# Patient Record
Sex: Male | Born: 2000 | Race: Black or African American | Hispanic: No | Marital: Single | Smoking: Never smoker
Health system: Southern US, Community
[De-identification: ages and names within clinical notes are randomized; demographics above are authoritative.]

## PROBLEM LIST (undated history)

## (undated) DIAGNOSIS — J45909 Unspecified asthma, uncomplicated: Secondary | ICD-10-CM

---
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
## 2022-05-30 ENCOUNTER — Emergency Department (HOSPITAL_COMMUNITY): Payer: Medicaid Other

## 2022-05-30 ENCOUNTER — Emergency Department (HOSPITAL_COMMUNITY)
Admission: EM | Admit: 2022-05-30 | Discharge: 2022-05-30 | Disposition: A | Discharge status: Home / Self Care | Payer: Medicaid Other | Attending: Emergency Medicine | Admitting: Emergency Medicine

## 2022-05-30 ENCOUNTER — Encounter (HOSPITAL_COMMUNITY): Payer: Self-pay

## 2022-05-30 ENCOUNTER — Other Ambulatory Visit: Payer: Self-pay

## 2022-05-30 DIAGNOSIS — R0781 Pleurodynia: Secondary | ICD-10-CM | POA: Insufficient documentation

## 2022-05-30 DIAGNOSIS — R0602 Shortness of breath: Secondary | ICD-10-CM | POA: Diagnosis not present

## 2022-05-30 DIAGNOSIS — M549 Dorsalgia, unspecified: Secondary | ICD-10-CM | POA: Diagnosis not present

## 2022-05-30 DIAGNOSIS — R0600 Dyspnea, unspecified: Secondary | ICD-10-CM | POA: Diagnosis not present

## 2022-05-30 DIAGNOSIS — J45909 Unspecified asthma, uncomplicated: Secondary | ICD-10-CM | POA: Insufficient documentation

## 2022-05-30 DIAGNOSIS — R079 Chest pain, unspecified: Secondary | ICD-10-CM | POA: Diagnosis not present

## 2022-05-30 LAB — BASIC METABOLIC PANEL
Anion gap: 8 (ref 5–15)
BUN: 10 mg/dL (ref 6–20)
CO2: 28 mmol/L (ref 22–32)
Calcium: 9.2 mg/dL (ref 8.9–10.3)
Chloride: 103 mmol/L (ref 98–111)
Creatinine, Ser: 0.87 mg/dL (ref 0.61–1.24)
GFR, Estimated: >60 mL/min (ref >60)
Glucose, Bld: 101 mg/dL — ABNORMAL HIGH (ref 70–99)
Potassium: 3.8 mmol/L (ref 3.5–5.1)
Sodium: 139 mmol/L (ref 135–145)

## 2022-05-30 LAB — CBC
HCT: 43.8 % (ref 39.0–52.0)
Hemoglobin: 14.5 g/dL (ref 13.0–17.0)
MCH: 28.5 pg (ref 26.0–34.0)
MCHC: 33.1 g/dL (ref 30.0–36.0)
MCV: 86.2 fL (ref 80.0–100.0)
Platelets: 286 10*3/uL (ref 150–400)
RBC: 5.08 MIL/uL (ref 4.22–5.81)
RDW: 13.9 % (ref 11.5–15.5)
WBC: 8.4 10*3/uL (ref 4.0–10.5)
nRBC: 0 % (ref 0.0–0.2)

## 2022-05-30 LAB — D-DIMER, QUANTITATIVE: D-Dimer, Quant: 0.28 ug/mL-FEU (ref 0.00–0.50)

## 2022-05-30 MED ORDER — HYDROCODONE-ACETAMINOPHEN 5-325 MG PO TABS
1.0000 | ORAL_TABLET | Freq: Once | ORAL | Status: AC
  Administered 2022-05-30: 1 via ORAL
  Filled 2022-05-30: qty 1

## 2022-05-30 MED ORDER — ALBUTEROL SULFATE HFA 108 (90 BASE) MCG/ACT IN AERS
2.0000 | INHALATION_SPRAY | Freq: Once | RESPIRATORY_TRACT | Status: AC
  Administered 2022-05-30: 2 via RESPIRATORY_TRACT
  Filled 2022-05-30: qty 6.7

## 2022-05-30 NOTE — ED Provider Notes (Signed)
[facility] EMERGENCY DEPARTMENT Provider Note   CSN: [id number] Arrival date & time: 05/30/22  0158     History  Chief Complaint  Patient presents with   Back Pain    Henry Krueger is a 21 y.o. male.  HPI Patient with history of obesity and asthma presents with 2 complaints.  Patient reports about 2 days ago he began having left-sided back pain.  No recent falls or trauma.  He does report recent heavy lifting about a week ago.  He also reports increasing shortness of breath of the past 2 days.  Appears to worsen tonight.  He reports that shortness of breath is improved with ambulation.  No fevers or vomiting.  No anterior chest pain.  No coughing or URI symptoms.  He is a non-smoker.   Past Medical History:  Diagnosis Date   Asthma     Home Medications Prior to Admission medications   Not on File      Allergies    Patient has no known allergies.    Review of Systems   Review of Systems  HENT:  Negative for sore throat.   Respiratory:  Positive for shortness of breath. Negative for cough.     Physical Exam Updated Vital Signs BP 127/79 (BP Location: Right Arm)   Pulse 86   Temp 97.7 F (36.5 C) (Oral)   Resp 18   Ht 1.727 m (5\' 8" )   Wt (!) 150.1 kg   SpO2 99%   BMI 50.33 kg/m  Physical Exam CONSTITUTIONAL: Well developed/well nourished HEAD: Normocephalic/atraumatic EYES: EOMI/PERRL ENMT: Mucous membranes moist NECK: supple no meningeal signs SPINE/BACK:entire spine nontender No bruising/crepitance/stepoffs noted to spine CV: S1/S2 noted, no murmurs/rubs/gallops noted LUNGS: Lungs are clear to auscultation bilaterally, no apparent distress Chest-mild tenderness to left posterior chest wall, no crepitus or bruising no rash ABDOMEN: soft, nontender, no rebound or guarding, bowel sounds noted throughout abdomen GU:no cva tenderness NEURO: Pt is awake/alert/appropriate, moves all extremitiesx4.  No facial droop.   EXTREMITIES: pulses normal/equal,  full ROM, no calf tenderness SKIN: warm, color normal PSYCH: Mildly anxious  ED Results / Procedures / Treatments   Labs (all labs ordered are listed, but only abnormal results are displayed) Labs Reviewed  BASIC METABOLIC PANEL - Abnormal; Notable for the following components:      Result Value   Glucose, Bld 101 (*)    All other components within normal limits  CBC  D-DIMER, QUANTITATIVE    EKG EKG Interpretation  Date/Time:  Saturday May 30 2022 04:36:49 EST Ventricular Rate:  99 PR Interval:  140 QRS Duration: 79 QT Interval:  319 QTC Calculation: 410 R Axis:   58 Text Interpretation: Sinus rhythm Interpretation limited secondary to artifact No previous ECGs available Confirmed by [id number] (Zadie Rhine) on 05/30/2022 4:39:13 AM  Radiology DG Chest 2 View  Result Date: 05/30/2022 CLINICAL DATA:  Left back/muscle strain and pain. Difficulty breathing. History of asthma. EXAM: CHEST - 2 VIEW COMPARISON:  09/26/2010. FINDINGS: The heart size and mediastinal contours are within normal limits. Both lungs are clear. No acute osseous abnormality. IMPRESSION: No active cardiopulmonary disease. Electronically Signed   By: 11/26/2010 M.D.   On: 05/30/2022 04:37    Procedures Procedures    Medications Ordered in ED Medications  albuterol (VENTOLIN HFA) 108 (90 Base) MCG/ACT inhaler 2 puff (has no administration in time range)  HYDROcodone-acetaminophen (NORCO/VICODIN) 5-325 MG per tablet 1 tablet (1 tablet Oral Given 05/30/22 0449)    ED  Course/ Medical Decision Making/ A&P Clinical Course as of 05/30/22 [id number]  Sat May 30, 2022  0508 Unclear cause of shortness of breath.  He did ambulate without hypoxia, became tachycardic.  Will do further exploration including D-dimer.  He reports he has outgrown his asthma and has not had any recent exacerbations, and he has no wheezing at this time [DW]  0638 Vitals improved.  No hypoxia.  He is resting comfortably.  He reports he  only has pain now when he takes a deep breath or moves.  He is low risk for PE, and D-dimer is negative.  X-ray is clear, low suspicion for CHF.  Patient will be discharged home.  I have given instructions to follow-up as an outpatient. [DW]    Clinical Course User Index [DW] Ripley Fraise, MD                           Medical Decision Making Amount and/or Complexity of Data Reviewed Labs: ordered. Radiology: ordered. ECG/medicine tests: ordered.  Risk Prescription drug management.   This patient presents to the ED for concern of shortness of breath, this involves an extensive number of treatment options, and is a complaint that carries with it a high risk of complications and morbidity.  The differential diagnosis includes but is not limited to Acute coronary syndrome, pneumonia, acute pulmonary edema, pneumothorax, acute anemia, pulmonary embolism, asthma attack    Comorbidities that complicate the patient evaluation: Patient's presentation is complicated by their history of obesity and asthma  Social Determinants of Health: Patient's impaired access to primary care  increases the complexity of managing their presentation  Additional history obtained: Additional history obtained from family   Lab Tests: I Ordered, and personally interpreted labs.  The pertinent results include: Labs overall unremarkable  Imaging Studies ordered: I ordered imaging studies including X-ray chest   I independently visualized and interpreted imaging which showed no acute findings I agree with the radiologist interpretation   Medicines ordered and prescription drug management: I ordered medication including Vicodin for pain Reevaluation of the patient after these medicines showed that the patient    improved  Test Considered: Patient is low risk / negative by Wells criteria with negative D-dimer, therefore do not feel that CT chest is indicated.   Reevaluation: After the interventions  noted above, I reevaluated the patient and found that they have :improved  Complexity of problems addressed: Patient's presentation is most consistent with  acute presentation with potential threat to life or bodily function  Disposition: After consideration of the diagnostic results and the patient's response to treatment,  I feel that the patent would benefit from discharge   .           Final Clinical Impression(s) / ED Diagnoses Final diagnoses:  Shortness of breath  Rib pain    Rx / DC Orders ED Discharge Orders     None         Ripley Fraise, MD 05/30/22 [phone]

## 2022-05-30 NOTE — ED Notes (Signed)
pt ambulated with out difficulty around the nursing station. o2 remained 98% pulse increased to 122

## 2022-05-30 NOTE — ED Triage Notes (Signed)
Pt arrived via [facility] c/o left back/muscle strain/pain and difficulty breathing. Pt endorses Hx of asthma and reports he does not have an inhaler. Pt reports he was helping his Aunt move 4 days ago and back pain began shortly after.

## 2022-07-29 DIAGNOSIS — U071 COVID-19: Secondary | ICD-10-CM | POA: Diagnosis not present

## 2022-10-03 ENCOUNTER — Other Ambulatory Visit: Payer: Self-pay

## 2022-10-03 ENCOUNTER — Encounter (HOSPITAL_COMMUNITY): Payer: Self-pay | Admitting: Emergency Medicine

## 2022-10-03 ENCOUNTER — Emergency Department (HOSPITAL_COMMUNITY)
Admission: EM | Admit: 2022-10-03 | Discharge: 2022-10-03 | Disposition: A | Discharge status: Home / Self Care | Payer: Self-pay | Attending: Emergency Medicine | Admitting: Emergency Medicine

## 2022-10-03 ENCOUNTER — Emergency Department (HOSPITAL_COMMUNITY): Payer: Medicaid Other

## 2022-10-03 DIAGNOSIS — X501XXA Overexertion from prolonged static or awkward postures, initial encounter: Secondary | ICD-10-CM | POA: Insufficient documentation

## 2022-10-03 DIAGNOSIS — M25562 Pain in left knee: Secondary | ICD-10-CM | POA: Insufficient documentation

## 2022-10-03 DIAGNOSIS — Y9367 Activity, basketball: Secondary | ICD-10-CM | POA: Insufficient documentation

## 2022-10-03 MED ORDER — IBUPROFEN 400 MG PO TABS
400.0000 mg | ORAL_TABLET | Freq: Once | ORAL | Status: AC
  Administered 2022-10-03: 400 mg via ORAL
  Filled 2022-10-03: qty 1

## 2022-10-03 NOTE — ED Provider Notes (Signed)
[place] EMERGENCY DEPARTMENT AT [facility] Provider Note   CSN: [id number] Arrival date & time: 10/03/22  0846     History  Chief Complaint  Patient presents with   Knee Pain    Henry Krueger is a 22 y.o. male, no pertinent past medical history, who presents to the ED secondary to left knee pain that is been going on for the last couple days.  He states that he was playing some basketball on Wednesday, and landed hard after jumping, and had some knee pain.  Since then has had progressive worsening of left knee pain, and difficulty bearing weight.  He states that it hurts more to straighten it out, and feels like it is often locking.  He states that he is concerned because it is just so painful to bear weight on.  Denies any redness, swelling, trauma to the knee other than when jumping. No past surgery on knee.    Home Medications Prior to Admission medications   Not on File      Allergies    Patient has no known allergies.    Review of Systems   Review of Systems  Musculoskeletal:        +L Knee pain    Physical Exam Updated Vital Signs BP 126/84 (BP Location: Right Arm)   Pulse 99   Temp 98.3 F (36.8 C) (Oral)   Resp 19   Ht  (1.727 m)   Wt (!) 150.1 kg   SpO2 100%   BMI 50.31 kg/m  Physical Exam Vitals and nursing note reviewed.  Constitutional:      General: He is not in acute distress.    Appearance: He is well-developed.  HENT:     Head: Normocephalic and atraumatic.  Eyes:     General:        Right eye: No discharge.        Left eye: No discharge.     Conjunctiva/sclera: Conjunctivae normal.  Pulmonary:     Effort: No respiratory distress.  Musculoskeletal:     Comments: Left Knee: Tenderness to palpation of lateral joint line. An effusion is not present.  Negative anterior and posterior drawer. Negative Mcmurray's. +Patellar stability. Negative valgus stress test. +varus test. Extension and flexion intact. No sensory deficits.     Skin:    General: Skin is warm and dry.     Findings: No erythema.  Neurological:     Mental Status: He is alert.     Comments: Clear speech.   Psychiatric:        Behavior: Behavior normal.        Thought Content: Thought content normal.     ED Results / Procedures / Treatments   Labs (all labs ordered are listed, but only abnormal results are displayed) Labs Reviewed - No data to display  EKG None  Radiology DG Knee Complete 4 Views Left  Result Date: 10/03/2022 CLINICAL DATA:  Persistent left knee pain since twisting injury playing basketball on Wednesday. EXAM: LEFT KNEE - COMPLETE 4+ VIEW COMPARISON:  None Available. FINDINGS: No evidence of fracture, dislocation, or joint effusion. No evidence of arthropathy or other focal bone abnormality. Soft tissues are unremarkable. IMPRESSION: Negative. Electronically Signed   By: Obie Dredge M.D.   On: 10/03/2022 10:30    Procedures Procedures    Medications Ordered in ED Medications  ibuprofen (ADVIL) tablet 400 mg (400 mg Oral Given 10/03/22 0945)  ibuprofen (ADVIL) tablet 400 mg (400 mg  Oral Given 10/03/22 0944)    ED Course/ Medical Decision Making/ A&P                             Medical Decision Making Patient is a 22 year old male, here for left knee pain after jumping up in the air during basketball on the landing.  He states that he landed on his feet, but heard a pop and has had left knee pain since then, states it hurts to bear weight, and is gotten worse over the last couple days.  States it is more on the outside of his knee, and it feels like his knee buckles and then locks up at times.  Did not twist his knee per patient.  No redness, swelling.  Has a positive varus test, left lateral knee pain, I am suspicious for an LCL injury, will obtain x-ray to rule out any kind of bony issue.  Amount and/or Complexity of Data Reviewed Radiology: ordered.    Details: Unremarkable Discussion of management or test  interpretation with external provider(s): Discussed with mother, and son at bedside, concern for possible LCL injury, versus meniscal tear, we will place him in a knee immobilizer and provided with crutches.  Have him follow-up with orthopedics.  Discussed return precautions and he voiced understanding.  Good pulse, no neurodeficits.  Understanding of plan.  Ibuprofen Tylenol for pain control.  Risk Prescription drug management.    Final Clinical Impression(s) / ED Diagnoses Final diagnoses:  Acute pain of left knee    Rx / DC Orders ED Discharge Orders     None         Henry Pelt, PA 10/03/22 1044    Jacalyn Lefevre, MD 10/03/22 1515

## 2022-10-03 NOTE — Discharge Instructions (Addendum)
I am suspicious that he may have an LCL sprain/tear, please follow-up with orthopedics for further evaluation.  Use the knee immobilizer to help with stability, and then crutches as needed.  Return to the ER if you have any loss of sensation in your foot, coolness, or worsening pain.  Take Tylenol and ibuprofen for pain control.

## 2022-10-03 NOTE — ED Triage Notes (Signed)
Pt c/o L knee pain that started last Wednesday, states "I twisted it at school while playing a sports game" states that "it has felt locked up since"

## 2023-05-01 ENCOUNTER — Emergency Department (HOSPITAL_COMMUNITY)
Admission: EM | Admit: 2023-05-01 | Discharge: 2023-05-01 | Disposition: A | Discharge status: Home / Self Care | Payer: PRIVATE HEALTH INSURANCE | Attending: Emergency Medicine | Admitting: Emergency Medicine

## 2023-05-01 ENCOUNTER — Emergency Department (HOSPITAL_COMMUNITY): Payer: PRIVATE HEALTH INSURANCE

## 2023-05-01 ENCOUNTER — Other Ambulatory Visit: Payer: Self-pay

## 2023-05-01 ENCOUNTER — Encounter (HOSPITAL_COMMUNITY): Payer: Self-pay

## 2023-05-01 DIAGNOSIS — J069 Acute upper respiratory infection, unspecified: Secondary | ICD-10-CM | POA: Diagnosis not present

## 2023-05-01 DIAGNOSIS — R059 Cough, unspecified: Secondary | ICD-10-CM | POA: Diagnosis present

## 2023-05-01 DIAGNOSIS — Z1152 Encounter for screening for COVID-19: Secondary | ICD-10-CM | POA: Diagnosis not present

## 2023-05-01 DIAGNOSIS — J45909 Unspecified asthma, uncomplicated: Secondary | ICD-10-CM | POA: Diagnosis not present

## 2023-05-01 LAB — RESP PANEL BY RT-PCR (RSV, FLU A&B, COVID)  RVPGX2
Influenza A by PCR: NEGATIVE
Influenza B by PCR: NEGATIVE
Resp Syncytial Virus by PCR: NEGATIVE
SARS Coronavirus 2 by RT PCR: NEGATIVE

## 2023-05-01 MED ORDER — BENZONATATE 100 MG PO CAPS: 100.0000 mg | ORAL_CAPSULE | Freq: Three times a day (TID) | ORAL | 0 refills | Status: DC

## 2023-05-01 MED ORDER — GUAIFENESIN 100 MG/5ML PO LIQD: 100.0000 mg | ORAL | 0 refills | Status: DC | PRN

## 2023-05-01 NOTE — ED Provider Notes (Signed)
[place] EMERGENCY DEPARTMENT AT [facility] Provider Note   CSN: [id number] Arrival date & time: 05/01/23  1447     History  Chief Complaint  Patient presents with   Cough    Henry Krueger is a 22 y.o. male with asthma presents with complaints of productive cough and nasal congestion x 1 day.  He took ibuprofen this morning without any notable improvement.  He describes some nasal drainage in his chest.  In triage she described this as "heavy chest".  During my exam he denies any chest pain or shortness of breath.  He has not required increased use of his inhaler. No sick contacts.  Denies any nausea, vomiting, diarrhea, fevers or chills.   Cough      Home Medications Prior to Admission medications   Medication Sig Start Date End Date Taking? Authorizing Provider  benzonatate (TESSALON) 100 MG capsule Take 1 capsule (100 mg total) by mouth every 8 (eight) hours. 05/01/23  Yes Halford Decamp, PA-C  guaiFENesin (ROBITUSSIN) 100 MG/5ML liquid Take 5-10 mLs (100-200 mg total) by mouth every 4 (four) hours as needed for cough or to loosen phlegm. 05/01/23  Yes Halford Decamp, PA-C      Allergies    Patient has no known allergies.    Review of Systems   Review of Systems  Respiratory:  Positive for cough.     Physical Exam Updated Vital Signs BP 138/87 (BP Location: Right Arm)   Pulse (!) 110   Temp 99.3 F (37.4 C) (Oral)   Resp 20   Ht 5\' 9"  (1.753 m)   Wt (!) 145.2 kg   SpO2 95%   BMI 47.26 kg/m  Physical Exam Vitals and nursing note reviewed.  Constitutional:      General: He is not in acute distress.    Appearance: He is well-developed.  HENT:     Head: Normocephalic and atraumatic.     Right Ear: Tympanic membrane normal.     Left Ear: Tympanic membrane normal.     Nose: Congestion present.     Mouth/Throat:     Pharynx: No oropharyngeal exudate or posterior oropharyngeal erythema.  Eyes:     Conjunctiva/sclera: Conjunctivae  normal.  Cardiovascular:     Rate and Rhythm: Normal rate and regular rhythm.     Heart sounds: No murmur heard. Pulmonary:     Effort: Pulmonary effort is normal. No respiratory distress.     Breath sounds: Normal breath sounds.  Abdominal:     Palpations: Abdomen is soft.     Tenderness: There is no abdominal tenderness.  Musculoskeletal:        General: No swelling.     Cervical back: Neck supple.  Skin:    General: Skin is warm and dry.     Capillary Refill: Capillary refill takes less than 2 seconds.  Neurological:     Mental Status: He is alert.  Psychiatric:        Mood and Affect: Mood normal.     ED Results / Procedures / Treatments   Labs (all labs ordered are listed, but only abnormal results are displayed) Labs Reviewed  RESP PANEL BY RT-PCR (RSV, FLU A&B, COVID)  [id number]    EKG None  Radiology DG Chest 2 View  Result Date: 05/01/2023 CLINICAL DATA:  Cough. EXAM: CHEST - 2 VIEW COMPARISON:  05/30/2022 FINDINGS: Lateral view degraded by patient arm position. Midline trachea.  Normal heart size and mediastinal contours. Sharp costophrenic  angles.  No pneumothorax.  Clear lungs. IMPRESSION: No active cardiopulmonary disease. Electronically Signed   By: Jeronimo Greaves M.D.   On: 05/01/2023 16:07    Procedures Procedures    Medications Ordered in ED Medications - No data to display  ED Course/ Medical Decision Making/ A&P                                 Medical Decision Making Henry Krueger is a 22 y.o. male with asthma presents with complaints of productive cough and nasal congestion x 1 day.  Patient is 9 days so you started that is what about this but this way because you will build 100 so  Amount and/or Complexity of Data Reviewed Radiology: ordered.     Patient presents to the ED for concern of cough and nasal drainage, this involves an extensive number of treatment options, and is a complaint that carries with it a high risk of complications  and morbidity.  The differential diagnosis includes COVID, RSV, influenza, pneumonia, seasonal allergies, asthma, sinus infection.   Co morbidities that complicate the patient evaluation  Asthma   Additional history obtained:  Additional history obtained from mother at bedside Family   External records from outside source obtained and reviewed including none   Lab Tests:  I Ordered, and personally interpreted labs.  The pertinent results include: Negative viral panel   Imaging Studies ordered:  Chest x-ray ordered in triage I independently visualized and interpreted imaging which showed no cardio pulmonary disease I agree with the radiologist interpretation   Cardiac Monitoring:  No cardiac monitoring indicated   Medicines ordered and prescription drug management:  I ordered medication including guaifenesin and Tessalon Perles for cough I have reviewed the patients home medicines and have made adjustments as needed   Test Considered:  Strep pain and monotest.  Not consistent clinically.   Critical Interventions:  No critical interventions indicated   Consultations Obtained: No consultations indicated  Problem List / ED Course:  Cough   Reevaluation:  After the interventions noted above, I reevaluated the patient and found that they have :stayed the same   Social Determinants of Health:  none   Dispostion:  After consideration of the diagnostic results and the patients response to treatment, I feel that the patent would benefit from discharge home with guaifenesin and Tessalon Perles for presumed viral infection.         Final Clinical Impression(s) / ED Diagnoses Final diagnoses:  Viral URI with cough    Rx / DC Orders ED Discharge Orders          Ordered    guaiFENesin (ROBITUSSIN) 100 MG/5ML liquid  Every 4 hours PRN        05/01/23 1608    benzonatate (TESSALON) 100 MG capsule  Every 8 hours        05/01/23 1608               Halford Decamp, PA-C 05/01/23 1618    Eber Hong, MD 05/03/23 1025

## 2023-05-01 NOTE — Discharge Instructions (Addendum)
It was a pleasure taking care of you this afternoon.  You were evaluated in the emergency department with complaints of cough and nasal drainage.  A COVID, RSV, influenza test was performed which was negative.  A chest x-ray was also performed which did not show any acute abnormality.  Your clinical exam is consistent with a viral infection.  No antibiotics are indicated.  Please drink plenty of fluids.  I prescribed a cold medication and anticough medication.  If you experience any new or worsening symptoms include difficulty breathing, fevers, chills, worsening cough please return return to the emergency department.

## 2023-05-01 NOTE — ED Triage Notes (Signed)
Coughing up green mucus since this morning  Sinus drainage green  Nose clogged HA Tired

## 2023-09-11 ENCOUNTER — Ambulatory Visit
Admission: EM | Admit: 2023-09-11 | Discharge: 2023-09-11 | Disposition: A | Discharge status: Home / Self Care | Attending: Physician Assistant | Admitting: Physician Assistant

## 2023-09-11 DIAGNOSIS — J020 Streptococcal pharyngitis: Secondary | ICD-10-CM

## 2023-09-11 LAB — POCT RAPID STREP A (OFFICE): Rapid Strep A Screen: POSITIVE — AB

## 2023-09-11 MED ORDER — AMOXICILLIN 500 MG PO CAPS: 500.0000 mg | ORAL_CAPSULE | Freq: Two times a day (BID) | ORAL | 0 refills | Status: AC

## 2023-09-11 NOTE — ED Triage Notes (Signed)
 Pt reports he has a sore throat, headache, chest discomfort,  and a cough that started this morning.

## 2023-09-11 NOTE — Discharge Instructions (Signed)
 Take antibiotic as prescribed Recommend warm salt water gargles.  Can take Ibuprofen or Tylenol as needed for pain and fever If no improvement of symptoms become worse return for evaluation

## 2023-09-20 ENCOUNTER — Ambulatory Visit: Payer: PRIVATE HEALTH INSURANCE | Admitting: Physician Assistant

## 2023-10-05 ENCOUNTER — Ambulatory Visit (INDEPENDENT_AMBULATORY_CARE_PROVIDER_SITE_OTHER): Payer: PRIVATE HEALTH INSURANCE | Admitting: Physician Assistant

## 2023-10-05 ENCOUNTER — Encounter: Payer: Self-pay | Admitting: Physician Assistant

## 2023-10-05 VITALS — BP 138/84 | HR 90 | Temp 98.1°F | Ht 71.0 in | Wt 322.0 lb

## 2023-10-05 DIAGNOSIS — Z48816 Encounter for surgical aftercare following surgery on the genitourinary system: Secondary | ICD-10-CM | POA: Diagnosis not present

## 2023-10-05 DIAGNOSIS — Z114 Encounter for screening for human immunodeficiency virus [HIV]: Secondary | ICD-10-CM

## 2023-10-05 DIAGNOSIS — Z6841 Body Mass Index (BMI) 40.0 and over, adult: Secondary | ICD-10-CM

## 2023-10-05 DIAGNOSIS — Z23 Encounter for immunization: Secondary | ICD-10-CM | POA: Diagnosis not present

## 2023-10-05 DIAGNOSIS — Z0000 Encounter for general adult medical examination without abnormal findings: Secondary | ICD-10-CM

## 2023-10-05 DIAGNOSIS — Z1159 Encounter for screening for other viral diseases: Secondary | ICD-10-CM

## 2023-10-05 DIAGNOSIS — Z0001 Encounter for general adult medical examination with abnormal findings: Secondary | ICD-10-CM | POA: Diagnosis not present

## 2023-10-05 DIAGNOSIS — L309 Dermatitis, unspecified: Secondary | ICD-10-CM

## 2023-10-05 DIAGNOSIS — Z7689 Persons encountering health services in other specified circumstances: Secondary | ICD-10-CM

## 2023-10-05 MED ORDER — TRIAMCINOLONE ACETONIDE 0.1 % EX CREA
1.0000 | TOPICAL_CREAM | Freq: Two times a day (BID) | CUTANEOUS | 1 refills | Status: AC
Start: 2023-10-05 — End: ?

## 2023-10-05 NOTE — Progress Notes (Signed)
 Complete physical exam  Patient: Henry Krueger   DOB: 30-Apr-2001   22 y.o. Male  MRN: [id number]  Subjective:    Chief Complaint  Patient presents with   Annual Exam    Physical, dry skin and would like referral to surgeon for foreskin     Henry Krueger is a 23 y.o. male who presents today for a complete physical exam. He reports consuming a  calorie deficit  diet. Gym/ health club routine includes exercise 3 times a week. He generally feels well. He reports sleeping well. He does have additional problems to discuss today.   Requests referral to general surgery today for circumcision consultation.  Also concern for dry skin and hands/knuckles.   Most recent fall risk assessment:    10/05/2023    3:33 PM  Fall Risk   Falls in the past year? 0     Most recent depression screenings:    10/05/2023    3:33 PM  PHQ 2/9 Scores  PHQ - 2 Score 0  PHQ- 9 Score 0    Vision:Not within last year  and Dental: No current dental problems and No regular dental care   Patient Care Team: Areil Ottey, [place], [place] as PCP - General (Physician Assistant)   Outpatient Medications Prior to Visit  Medication Sig   [DISCONTINUED] benzonatate (TESSALON) 100 MG capsule Take 1 capsule (100 mg total) by mouth every 8 (eight) hours.   [DISCONTINUED] guaiFENesin (ROBITUSSIN) 100 MG/5ML liquid Take 5-10 mLs (100-200 mg total) by mouth every 4 (four) hours as needed for cough or to loosen phlegm.   No facility-administered medications prior to visit.    Review of Systems  Constitutional:  Negative for chills, fever and malaise/fatigue.  Eyes:  Negative for blurred vision and double vision.  Respiratory:  Negative for cough and shortness of breath.   Cardiovascular:  Negative for chest pain and palpitations.  Musculoskeletal:  Negative for joint pain and myalgias.  Skin:  Positive for rash.  Neurological:  Negative for dizziness and headaches.  Psychiatric/Behavioral:  Negative for  depression. The patient is not nervous/anxious.       Objective:     BP 138/84   Pulse 90   Temp 98.1 F (36.7 C)   Ht 5\' 11"  (1.803 m)   Wt (!) 322 lb (146.1 kg)   SpO2 97%   BMI 44.91 kg/m    Physical Exam Constitutional:      Appearance: Normal appearance. He is obese.  HENT:     Head: Normocephalic.     Mouth/Throat:     Mouth: Mucous membranes are moist.     Pharynx: Oropharynx is clear.  Eyes:     Extraocular Movements: Extraocular movements intact.     Conjunctiva/sclera: Conjunctivae normal.  Cardiovascular:     Rate and Rhythm: Normal rate and regular rhythm.     Heart sounds: Normal heart sounds. No murmur heard. Pulmonary:     Effort: Pulmonary effort is normal.     Breath sounds: Normal breath sounds.  Skin:    General: Skin is warm and dry.     Findings: Rash present. Rash is macular, papular and scaling.     Comments: Dry scaling rash on bilateral hands/knuckles  Neurological:     General: No focal deficit present.     Mental Status: He is alert and oriented to person, place, and time.  Psychiatric:        Mood and Affect: Mood normal.  Behavior: Behavior normal.      No results found for any visits on 10/05/23.     Assessment & Plan:    Routine Health Maintenance and Physical Exam  Health Maintenance  Topic Date Due   HIV Screening  Never done   Meningitis B Vaccine (1 of 2 - Standard) Never done   Hepatitis C Screening  Never done   HPV Vaccine (2 - Male 3-dose series) 11/02/2023   COVID-19 Vaccine (1 - 2024-25 season) 10/21/2023*   DTaP/Tdap/Td vaccine (1 - Tdap) 10/04/2024*   Flu Shot  01/21/2024  *Topic was postponed. The date shown is not the original due date.    Discussed health benefits of physical activity, and encouraged him to engage in regular exercise appropriate for his age and condition.  Problem List Items Addressed This Visit     Eczema of hand   Relevant Medications   triamcinolone cream (KENALOG) 0.1 %    Other Visit Diagnoses       Encounter to establish care    -  Primary     Annual visit for general adult medical examination without abnormal findings       Relevant Orders   CBC with Differential   CMP14+EGFR   Hepatitis C Antibody   HIV antibody (with reflex)     Immunization due       Relevant Orders   MenQuadfi-Meningococcal (Groups A, C, Y, W) Conjugate Vaccine (Completed)   HPV 9-valent vaccine,Recombinat (Completed)     Encounter for assessment of circumcision       Relevant Orders   Ambulatory referral to General Surgery     BMI 40.0-44.9, adult (HCC)       Relevant Orders   CMP14+EGFR   Lipid Panel     Screening for HIV (human immunodeficiency virus)       Relevant Orders   HIV antibody (with reflex)     Need for hepatitis C screening test       Relevant Orders   Hepatitis C Antibody      Adult wellness-complete.wellness physical was conducted today. Importance of diet and exercise were discussed in detail.  Importance of stress reduction and healthy living were discussed.  In addition to this a discussion regarding safety was also covered.  We also reviewed over immunizations and gave recommendations regarding current immunization needed for age.   In addition to this additional areas were also touched on including: bilateral hand eczema. Started on topical triamcinolone and advised thick cream and emollient.   Preventative health exams needed: Hep C and HIV screening ordered today. HPV dose 1 and meningococcal B dose 1 given today.   Colonoscopy not indicated  Patient was advised yearly wellness exam   Return in about 1 year (around 10/04/2024).     Jearlean Mince [place] Throckmorton, PA-C

## 2023-10-06 ENCOUNTER — Encounter: Payer: Self-pay | Admitting: Physician Assistant

## 2023-10-06 LAB — CBC WITH DIFFERENTIAL/PLATELET
Basophils Absolute: 0 10*3/uL (ref 0.0–0.2)
Basos: 0 %
EOS (ABSOLUTE): 0.3 10*3/uL (ref 0.0–0.4)
Eos: 4 %
Hematocrit: 43.5 % (ref 37.5–51.0)
Hemoglobin: 14.7 g/dL (ref 13.0–17.7)
Immature Grans (Abs): 0 10*3/uL (ref 0.0–0.1)
Immature Granulocytes: 0 %
Lymphocytes Absolute: 2.7 10*3/uL (ref 0.7–3.1)
Lymphs: 34 %
MCH: 28.8 pg (ref 26.6–33.0)
MCHC: 33.8 g/dL (ref 31.5–35.7)
MCV: 85 fL (ref 79–97)
Monocytes Absolute: 0.7 10*3/uL (ref 0.1–0.9)
Monocytes: 9 %
Neutrophils Absolute: 4.1 10*3/uL (ref 1.4–7.0)
Neutrophils: 53 %
Platelets: 342 10*3/uL (ref 150–450)
RBC: 5.1 x10E6/uL (ref 4.14–5.80)
RDW: 13.6 % (ref 11.6–15.4)
WBC: 7.8 10*3/uL (ref 3.4–10.8)

## 2023-10-06 LAB — CMP14+EGFR
ALT: 21 IU/L (ref 0–44)
AST: 20 IU/L (ref 0–40)
Albumin: 4.2 g/dL — ABNORMAL LOW (ref 4.3–5.2)
Alkaline Phosphatase: 73 IU/L (ref 51–125)
BUN/Creatinine Ratio: 11 (ref 9–20)
BUN: 9 mg/dL (ref 6–20)
Bilirubin Total: 0.5 mg/dL (ref 0.0–1.2)
CO2: 24 mmol/L (ref 20–29)
Calcium: 9.3 mg/dL (ref 8.7–10.2)
Chloride: 100 mmol/L (ref 96–106)
Creatinine, Ser: 0.82 mg/dL (ref 0.76–1.27)
Globulin, Total: 3.4 g/dL (ref 1.5–4.5)
Glucose: 86 mg/dL (ref 70–99)
Potassium: 4.9 mmol/L (ref 3.5–5.2)
Sodium: 138 mmol/L (ref 134–144)
Total Protein: 7.6 g/dL (ref 6.0–8.5)
eGFR: 129 mL/min/{1.73_m2} (ref >59)

## 2023-10-06 LAB — HIV ANTIBODY (ROUTINE TESTING W REFLEX): HIV Screen 4th Generation wRfx: NONREACTIVE

## 2023-10-06 LAB — LIPID PANEL
Chol/HDL Ratio: 4.6 ratio (ref 0.0–5.0)
Cholesterol, Total: 162 mg/dL (ref 100–199)
HDL: 35 mg/dL — ABNORMAL LOW (ref >39)
LDL Chol Calc (NIH): 105 mg/dL — ABNORMAL HIGH (ref 0–99)
Triglycerides: 123 mg/dL (ref 0–149)
VLDL Cholesterol Cal: 22 mg/dL (ref 5–40)

## 2023-10-06 LAB — HEPATITIS C ANTIBODY: Hep C Virus Ab: NONREACTIVE

## 2023-10-20 NOTE — Addendum Note (Signed)
 Addended by: Marco Severs on: 10/20/2023 03:01 PM   Modules accepted: Orders

## 2023-11-08 ENCOUNTER — Ambulatory Visit: Payer: PRIVATE HEALTH INSURANCE | Admitting: Physician Assistant

## 2023-12-07 ENCOUNTER — Ambulatory Visit: Payer: PRIVATE HEALTH INSURANCE

## 2023-12-17 ENCOUNTER — Encounter (HOSPITAL_COMMUNITY): Payer: Self-pay

## 2023-12-17 ENCOUNTER — Emergency Department (HOSPITAL_COMMUNITY)

## 2023-12-17 ENCOUNTER — Emergency Department (HOSPITAL_COMMUNITY)
Admission: EM | Admit: 2023-12-17 | Discharge: 2023-12-17 | Disposition: A | Discharge status: Home / Self Care | Attending: Emergency Medicine | Admitting: Emergency Medicine

## 2023-12-17 DIAGNOSIS — M25562 Pain in left knee: Secondary | ICD-10-CM | POA: Insufficient documentation

## 2023-12-17 MED ORDER — NAPROXEN 500 MG PO TABS: 500.0000 mg | ORAL_TABLET | Freq: Two times a day (BID) | ORAL | 0 refills | Status: DC

## 2023-12-17 NOTE — ED Notes (Signed)
 Pt/family received d/c paperwork at this time. After going over the paperwork any questions, comments, or concerns were answered to the best of this nurse's knowledge. The pt/family verbally acknowledged the teachings/instructions.   Pt was given the yellow ortho order form for immobilizer and crutches.

## 2023-12-17 NOTE — ED Triage Notes (Addendum)
 Pt comes in for left knee pain. Pt was previously dx with torn ligament about 1 yr ago. Pt states the pain started yesterday when he was sitting down playing a video game. Pt is A&ox4. Pt states it hurts to bear weight and is unable to straighten it out all of the way. PMS intact

## 2023-12-17 NOTE — ED Provider Notes (Signed)
 [facility] AT [facility] Provider Note   CSN: [id number] Arrival date & time: 12/17/23  1029     Patient presents with: Knee Pain (left)   Henry Krueger is a 23 y.o. male.   Patient is a 23 year old male who presents to the emergency department chief complaint of left knee pain.  Patient notes Henry Krueger was playing videogames yesterday when Henry Krueger felt as though his knee locked in place.  Henry Krueger has had difficulty fully straighten his knee since that time.  Henry Krueger denies any overlying erythema, warmth or swelling.  Henry Krueger denies any numbness or paresthesias distally.  Henry Krueger denies any long bone or joint pain at this time.  Henry Krueger does note that Henry Krueger has a previous injury to the knee which Henry Krueger did not follow-up on.  Henry Krueger denies any recent falls or blunt trauma.   Knee Pain      Prior to Admission medications   Medication Sig Start Date End Date Taking? Authorizing Provider  triamcinolone  cream (KENALOG ) 0.1 % Apply 1 Application topically 2 (two) times daily. 10/05/23   Grooms, [place], PA-C    Allergies: Patient has no known allergies.    Review of Systems  Musculoskeletal:        Pain to the left knee  All other systems reviewed and are negative.   Updated Vital Signs BP 139/89 (BP Location: Right Arm)   Pulse 92   Resp 18   Ht 5' 9 (1.753 m)   Wt (!) 141.1 kg   SpO2 98%   BMI 45.93 kg/m   Physical Exam Vitals and nursing note reviewed.  Constitutional:      Appearance: Normal appearance.  HENT:     Head: Normocephalic and atraumatic.   Eyes:     Extraocular Movements: Extraocular movements intact.     Conjunctiva/sclera: Conjunctivae normal.     Pupils: Pupils are equal, round, and reactive to light.    Cardiovascular:     Rate and Rhythm: Normal rate and regular rhythm.     Pulses: Normal pulses.  Pulmonary:     Effort: Pulmonary effort is normal. No respiratory distress.  Abdominal:     General: Bowel sounds are normal.   Musculoskeletal:         General: Normal range of motion.     Comments: Nontender palpation over bilateral lower extremities, no overlying erythema or warmth of left knee, no edema, DP and PT pulses are 2+ distally, sensation intact distally, full range of motion noted throughout, no ligamentous laxity noted over the knee, no obvious deformity or bruising, no skin breakdown or ulceration, no lacerations or abrasions   Skin:    General: Skin is warm and dry.   Neurological:     General: No focal deficit present.     Mental Status: Henry Krueger is alert and oriented to person, place, and time. Mental status is at baseline.     (all labs ordered are listed, but only abnormal results are displayed) Labs Reviewed - No data to display  EKG: None  Radiology: DG Knee Complete 4 Views Left Result Date: 12/17/2023 CLINICAL DATA:  Pain EXAM: LEFT KNEE - COMPLETE 4 VIEW COMPARISON:  Knee x-ray 10/03/2022. FINDINGS: No fracture or dislocation. Preserved joint spaces and bone mineralization. No joint effusion on lateral view. IMPRESSION: No acute osseous abnormality. Electronically Signed   By: Ranell Bring M.D.   On: 12/17/2023 11:22     Procedures   Medications Ordered in the ED - No data  to display                                  Medical Decision Making Patient is doing well at this time and is stable for discharge home.  Discussed with patient that x-ray demonstrated no signs of acute osseous injury or lesions.  Suspect Henry Krueger may be suffering from underlying meniscal injury at this point.  The need for close follow-up with orthopedics was discussed.  Will provide knee immobilizer and crutches.  Strict turn precautions were provided for any new or worsening symptoms.  Henry Krueger has no clinical indication for septic joint or gout.  Patient voiced understand to the plan and had no additional questions.  Amount and/or Complexity of Data Reviewed Radiology: ordered.  Risk Prescription drug management.        Final diagnoses:   None    ED Discharge Orders     None          Daralene Lonni JONETTA DEVONNA 12/17/23 1228    Suzette Pac, MD 12/20/23 [phone]

## 2023-12-17 NOTE — Discharge Instructions (Signed)
 Please follow-up closely with orthopedics on an outpatient basis.  Return to emergency department immediately for any new or worsening symptoms.

## 2024-03-01 ENCOUNTER — Other Ambulatory Visit: Payer: Self-pay | Admitting: Urology

## 2024-03-02 NOTE — Pre-Procedure Instructions (Signed)
 For Anesthesia: PCP - Charmaine Grooms Cardiologist - na  Bowel Prep reminder:na  Chest x-ray - na EKG - 2021 epic Stress Test - na ECHO - na Cardiac Cath - na Pacemaker/ICD device last checked:na Pacemaker orders received:na Device Rep notified:na  Spinal Cord Stimulator:na  Sleep Study - na CPAP -   Fasting Blood Sugar - na   Last dose of GLP1 agonist-  GLP1 instructions: Hold 7 days prior to schedule (Hold 24 hours-daily)   Last dose of SGLT-2 inhibitors-  SGLT-2 instructions: Hold 72 hours prior to surgery  Blood Thinner Instructions: Aspirin Instructions: Last Dose:  Activity level: Can go up a flight of stairs and activities of daily living without stopping and without chest pain and/or shortness of breath   Able to exercise without chest pain and/or shortness of breath   Unable to go up a flight of stairs without chest pain and/or shortness of breath     Anesthesia review:   Patient denies shortness of breath, fever, cough and chest pain at PAT appointment   Patient verbalized understanding of instructions that were reviewed over the telephone.

## 2024-03-02 NOTE — Patient Instructions (Addendum)
 Attempted to contact pt for preop phone call, no answer

## 2024-03-06 NOTE — Progress Notes (Addendum)
 For Anesthesia: PCP -  Cardiologist -   Bowel Prep reminder:  Chest x-ray -  EKG - greater than 1 year Stress Test -  ECHO -  Cardiac Cath -  Pacemaker/ICD device last checked: Pacemaker orders received: Device Rep notified:  Spinal Cord Stimulator:  Sleep Study -  CPAP -   Fasting Blood Sugar -  Checks Blood Sugar _____ times a day Date and result of last Hgb A1c-  Last dose of GLP1 agonist-  GLP1 instructions: Hold 7 days prior to schedule (Hold 24 hours-daily)   Last dose of SGLT-2 inhibitors-  SGLT-2 instructions: Hold 72 hours prior to surgery  Blood Thinner Instructions: Aspirin Instructions: Last Dose:  Activity level: Can go up a flight of stairs and activities of daily living without stopping and without chest pain and/or shortness of breath   Able to exercise without chest pain and/or shortness of breath   Unable to go up a flight of stairs without chest pain and/or shortness of breath     Anesthesia review:   Patient denies shortness of breath, fever, cough and chest pain at PAT appointment   Patient verbalized understanding of instructions that were reviewed over the telephone.

## 2024-03-06 NOTE — Progress Notes (Signed)
 Attempted to obtain medical history via telephone, unable to reach at this time. HIPAA compliant voicemail message left requesting return call to pre surgical testing department.

## 2024-03-07 ENCOUNTER — Encounter (HOSPITAL_COMMUNITY): Payer: Self-pay

## 2024-03-07 NOTE — Progress Notes (Signed)
 Attempted to obtain medical history via telephone, unable to reach at this time. HIPAA compliant voicemail message left requesting return call to pre surgical testing department. I also sent a my chart message asking patient to call us  back to complete pre op.

## 2024-03-08 ENCOUNTER — Encounter (HOSPITAL_COMMUNITY)
Admission: RE | Disposition: A | Discharge status: Home / Self Care | Payer: Self-pay | Source: Home / Self Care | Attending: Urology

## 2024-03-08 ENCOUNTER — Ambulatory Visit (HOSPITAL_COMMUNITY): Admitting: Anesthesiology

## 2024-03-08 ENCOUNTER — Ambulatory Visit (HOSPITAL_COMMUNITY)
Admission: RE | Admit: 2024-03-08 | Discharge: 2024-03-08 | Disposition: A | Discharge status: Home / Self Care | Attending: Urology | Admitting: Urology

## 2024-03-08 ENCOUNTER — Encounter (HOSPITAL_COMMUNITY): Payer: Self-pay | Admitting: Urology

## 2024-03-08 DIAGNOSIS — N471 Phimosis: Secondary | ICD-10-CM | POA: Diagnosis present

## 2024-03-08 DIAGNOSIS — E66813 Obesity, class 3: Secondary | ICD-10-CM | POA: Insufficient documentation

## 2024-03-08 DIAGNOSIS — Z6841 Body Mass Index (BMI) 40.0 and over, adult: Secondary | ICD-10-CM | POA: Insufficient documentation

## 2024-03-08 HISTORY — PX: CIRCUMCISION: SHX1350

## 2024-03-08 SURGERY — CIRCUMCISION, ADULT
Anesthesia: General

## 2024-03-08 MED ORDER — CEFAZOLIN SODIUM-DEXTROSE 3-4 GM/150ML-% IV SOLN
INTRAVENOUS | Status: AC
  Filled 2024-03-08: qty 150

## 2024-03-08 MED ORDER — ONDANSETRON HCL 4 MG/2ML IJ SOLN
INTRAMUSCULAR | Status: DC | PRN
  Administered 2024-03-08: 4 mg via INTRAVENOUS

## 2024-03-08 MED ORDER — GLYCOPYRROLATE 0.2 MG/ML IJ SOLN
INTRAMUSCULAR | Status: DC | PRN
  Administered 2024-03-08: .2 mg via INTRAVENOUS

## 2024-03-08 MED ORDER — MIDAZOLAM HCL 2 MG/2ML IJ SOLN
INTRAMUSCULAR | Status: AC
  Filled 2024-03-08: qty 2

## 2024-03-08 MED ORDER — OXYCODONE HCL 5 MG/5ML PO SOLN: 5.0000 mg | Freq: Once | ORAL | Status: AC | PRN

## 2024-03-08 MED ORDER — PROPOFOL 10 MG/ML IV BOLUS
INTRAVENOUS | Status: AC
Start: 2024-03-08 — End: 2024-03-08
  Filled 2024-03-08: qty 20

## 2024-03-08 MED ORDER — CEFAZOLIN SODIUM-DEXTROSE 3-4 GM/150ML-% IV SOLN
3.0000 g | Freq: Once | INTRAVENOUS | Status: AC
  Administered 2024-03-08: 3 g via INTRAVENOUS

## 2024-03-08 MED ORDER — SUCCINYLCHOLINE CHLORIDE 200 MG/10ML IV SOSY
PREFILLED_SYRINGE | INTRAVENOUS | Status: AC
  Filled 2024-03-08: qty 10

## 2024-03-08 MED ORDER — ONDANSETRON HCL 4 MG/2ML IJ SOLN
INTRAMUSCULAR | Status: AC
  Filled 2024-03-08: qty 2

## 2024-03-08 MED ORDER — BUPIVACAINE HCL (PF) 0.25 % IJ SOLN
INTRAMUSCULAR | Status: DC | PRN
  Administered 2024-03-08: 10 mL

## 2024-03-08 MED ORDER — LIDOCAINE HCL (PF) 2 % IJ SOLN
INTRAMUSCULAR | Status: DC | PRN
  Administered 2024-03-08: 100 mg via INTRADERMAL

## 2024-03-08 MED ORDER — OXYCODONE HCL 5 MG PO TABS
ORAL_TABLET | ORAL | Status: AC
  Filled 2024-03-08: qty 1

## 2024-03-08 MED ORDER — KETAMINE HCL 50 MG/5ML IJ SOSY
PREFILLED_SYRINGE | INTRAMUSCULAR | Status: AC
Start: 2024-03-08 — End: 2024-03-08
  Filled 2024-03-08: qty 5

## 2024-03-08 MED ORDER — DROPERIDOL 2.5 MG/ML IJ SOLN: 0.6250 mg | Freq: Once | INTRAMUSCULAR | Status: DC | PRN

## 2024-03-08 MED ORDER — MIDAZOLAM HCL 2 MG/2ML IJ SOLN
INTRAMUSCULAR | Status: DC | PRN
  Administered 2024-03-08: 2 mg via INTRAVENOUS

## 2024-03-08 MED ORDER — FENTANYL CITRATE (PF) 250 MCG/5ML IJ SOLN
INTRAMUSCULAR | Status: DC | PRN
  Administered 2024-03-08 (×2): 50 ug via INTRAVENOUS

## 2024-03-08 MED ORDER — PROPOFOL 10 MG/ML IV BOLUS
INTRAVENOUS | Status: DC | PRN
  Administered 2024-03-08: 300 mg via INTRAVENOUS

## 2024-03-08 MED ORDER — DEXAMETHASONE SODIUM PHOSPHATE 10 MG/ML IJ SOLN
INTRAMUSCULAR | Status: DC | PRN
  Administered 2024-03-08: 5 mg via INTRAVENOUS

## 2024-03-08 MED ORDER — DEXAMETHASONE SODIUM PHOSPHATE 10 MG/ML IJ SOLN
INTRAMUSCULAR | Status: AC
  Filled 2024-03-08: qty 1

## 2024-03-08 MED ORDER — PROPOFOL 10 MG/ML IV BOLUS
INTRAVENOUS | Status: AC
  Filled 2024-03-08: qty 20

## 2024-03-08 MED ORDER — LIDOCAINE HCL (PF) 2 % IJ SOLN
INTRAMUSCULAR | Status: AC
  Filled 2024-03-08: qty 5

## 2024-03-08 MED ORDER — KETAMINE HCL 50 MG/5ML IJ SOSY
PREFILLED_SYRINGE | INTRAMUSCULAR | Status: DC | PRN
  Administered 2024-03-08: 30 mg via INTRAVENOUS

## 2024-03-08 MED ORDER — CEFAZOLIN SODIUM-DEXTROSE 2-4 GM/100ML-% IV SOLN
2.0000 g | INTRAVENOUS | Status: DC
  Filled 2024-03-08: qty 100

## 2024-03-08 MED ORDER — BUPIVACAINE HCL (PF) 0.25 % IJ SOLN
INTRAMUSCULAR | Status: AC
  Filled 2024-03-08: qty 30

## 2024-03-08 MED ORDER — FENTANYL CITRATE (PF) 250 MCG/5ML IJ SOLN
INTRAMUSCULAR | Status: AC
  Filled 2024-03-08: qty 5

## 2024-03-08 MED ORDER — HYDROMORPHONE HCL 1 MG/ML IJ SOLN: 0.2500 mg | INTRAMUSCULAR | Status: DC | PRN

## 2024-03-08 MED ORDER — TRAMADOL HCL 50 MG PO TABS: 50.0000 mg | ORAL_TABLET | Freq: Four times a day (QID) | ORAL | 0 refills | Status: AC | PRN

## 2024-03-08 MED ORDER — OXYCODONE HCL 5 MG PO TABS
5.0000 mg | ORAL_TABLET | Freq: Once | ORAL | Status: AC | PRN
  Administered 2024-03-08: 5 mg via ORAL

## 2024-03-08 MED ORDER — 0.9 % SODIUM CHLORIDE (POUR BTL) OPTIME
TOPICAL | Status: DC | PRN
Start: 2024-03-08 — End: 2024-03-08
  Administered 2024-03-08: 1000 mL

## 2024-03-08 MED ORDER — ONDANSETRON HCL 4 MG/2ML IJ SOLN: 4.0000 mg | Freq: Once | INTRAMUSCULAR | Status: DC | PRN

## 2024-03-08 MED ORDER — LACTATED RINGERS IV SOLN: INTRAVENOUS | Status: DC

## 2024-03-08 MED ORDER — GLYCOPYRROLATE 0.2 MG/ML IJ SOLN
INTRAMUSCULAR | Status: AC
Start: 2024-03-08 — End: 2024-03-08
  Filled 2024-03-08: qty 1

## 2024-03-08 SURGICAL SUPPLY — 24 items
BAG COUNTER SPONGE SURGICOUNT (BAG) IMPLANT
BLADE SURG 15 STRL LF DISP TIS (BLADE) ×1 IMPLANT
BNDG COHESIVE 2X5 TAN ST LF (GAUZE/BANDAGES/DRESSINGS) ×1 IMPLANT
BNDG COHESIVE 3X5 TAN ST LF (GAUZE/BANDAGES/DRESSINGS) IMPLANT
COVER SURGICAL LIGHT HANDLE (MISCELLANEOUS) ×1 IMPLANT
DRAPE LAPAROTOMY T 98X78 PEDS (DRAPES) ×1 IMPLANT
ELECT NDL TIP 2.8 STRL (NEEDLE) ×1 IMPLANT
ELECT NEEDLE TIP 2.8 STRL (NEEDLE) ×1 IMPLANT
ELECT PENCIL ROCKER SW 15FT (MISCELLANEOUS) ×1 IMPLANT
ELECT REM PT RETURN 15FT ADLT (MISCELLANEOUS) ×1 IMPLANT
GAUZE PETROLATUM 1 X8 (GAUZE/BANDAGES/DRESSINGS) ×1 IMPLANT
GAUZE STRETCH 2X75IN STRL (MISCELLANEOUS) ×1 IMPLANT
GLOVE SURG LX STRL 8.0 MICRO (GLOVE) ×1 IMPLANT
GOWN STRL REUS W/ TWL LRG LVL3 (GOWN DISPOSABLE) ×1 IMPLANT
KIT BASIN OR (CUSTOM PROCEDURE TRAY) ×1 IMPLANT
KIT TURNOVER KIT A (KITS) ×1 IMPLANT
MAT PREVALON FULL STRYKER (MISCELLANEOUS) IMPLANT
NS IRRIG 1000ML POUR BTL (IV SOLUTION) ×1 IMPLANT
PACK BASIC VI WITH GOWN DISP (CUSTOM PROCEDURE TRAY) ×1 IMPLANT
SUT CHROMIC 3 0 SH 27 (SUTURE) ×2 IMPLANT
SUT PROLENE 4 0 P 3 18 (SUTURE) IMPLANT
SYR CONTROL 10ML LL (SYRINGE) ×1 IMPLANT
TOWEL OR 17X26 10 PK STRL BLUE (TOWEL DISPOSABLE) IMPLANT
WATER STERILE IRR 1000ML POUR (IV SOLUTION) IMPLANT

## 2024-03-08 NOTE — Anesthesia Postprocedure Evaluation (Signed)
 Anesthesia Post Note  Patient: Henry Krueger  Procedure(s) Performed: CIRCUMCISION, ADULT     Patient location during evaluation: PACU Anesthesia Type: General Level of consciousness: awake and alert and oriented Pain management: pain level controlled Vital Signs Assessment: post-procedure vital signs reviewed and stable Respiratory status: spontaneous breathing, nonlabored ventilation and respiratory function stable Cardiovascular status: blood pressure returned to baseline and stable Postop Assessment: no apparent nausea or vomiting Anesthetic complications: no   No notable events documented.  Last Vitals:  Vitals:   03/08/24 1230 03/08/24 1249  BP: 125/79 120/70  Pulse: 87   Resp: 17 18  Temp: 36.4 C   SpO2: 95%     Last Pain:  Vitals:   03/08/24 1249  TempSrc:   PainSc: 4                  Patrik Turnbaugh A.

## 2024-03-08 NOTE — H&P (Signed)
 Office Visit Report     02/14/2024   --------------------------------------------------------------------------------   Henry Krueger  MRN: [id number]  DOB: [birth year], 23 year old Male  SSN:    PRIMARY CARE:     REFERRING:    PROVIDER:  Lonni Han, M.D.  LOCATION:  [facility] [phone]     --------------------------------------------------------------------------------   CC: I have trouble rolling my foreskin back.  HPI: Henry Krueger is a 23 year-old male patient who is here for trouble with rollling back his foreskin.  He has had difficulties with rolling his foreskin back for 6 months. He was last able to easily roll his foreskin back approximately 07/24/2023. His symptoms have gotten worse over the last year.   He does not have dysuria. He has not had inflammation of the head of his penis. He has had to use creams on the head of his penis.   He does not have to strain or bear down to start his urinary stream. He does have a good size and strength to his urinary stream. His urinary stream does not start and stop during voiding.   He would like to be circumcised.     ALLERGIES: None   MEDICATIONS: None   GU PSH: None   NON-GU PSH: None   GU PMH: None   NON-GU PMH: None   FAMILY HISTORY: Cancer - Grandfather Hypertension - Grandmother seizures - Grandfather   SOCIAL HISTORY: Marital Status: Single Preferred Language: English; Race: Black or African American Current Smoking Status: Patient has never smoked.   Tobacco Use Assessment Completed: Used Tobacco in last 30 days? Has never drank.  Drinks 2 caffeinated drinks per day.    REVIEW OF SYSTEMS:    GU Review Male:   Patient reports trouble starting your stream and have to strain to urinate . Patient denies frequent urination, hard to postpone urination, burning/ pain with urination, get up at night to urinate, leakage of urine, stream starts and stops, erection problems, and  penile pain.  Gastrointestinal (Upper):   Patient denies nausea, vomiting, and indigestion/ heartburn.  Gastrointestinal (Lower):   Patient denies diarrhea and constipation.  Constitutional:   Patient denies fever, night sweats, weight loss, and fatigue.  Skin:   Patient denies skin rash/ lesion and itching.  Eyes:   Patient denies blurred vision and double vision.  Ears/ Nose/ Throat:   Patient denies sore throat and sinus problems.  Hematologic/Lymphatic:   Patient denies swollen glands and easy bruising.  Cardiovascular:   Patient denies leg swelling and chest pains.  Respiratory:   Patient denies cough and shortness of breath.  Endocrine:   Patient denies excessive thirst.  Musculoskeletal:   Patient denies back pain and joint pain.  Neurological:   Patient denies headaches and dizziness.  Psychologic:   Patient denies depression and anxiety.   VITAL SIGNS:      02/14/2024 11:07 AM  Weight 311 lb / 141.07 kg  Height 69 in / 175.26 cm  BP 108/67 mmHg  Pulse 74 /min  BMI 45.9 kg/m   GU PHYSICAL EXAMINATION:    Scrotum: No lesions. No edema. No cysts. No warts.  Epididymides: Right: no spermatocele, no masses, no cysts, no tenderness, no induration, no enlargement. Left: no spermatocele, no masses, no cysts, no tenderness, no induration, no enlargement.  Testes: No tenderness, no swelling, no enlargement left testes. No tenderness, no swelling, no enlargement right testes. Normal location left testes. Normal location right testes. No mass, no cyst, no varicocele, no  hydrocele left testes. No mass, no cyst, no varicocele, no hydrocele right testes.  Urethral Meatus: Normal size. No lesion, no wart, no discharge, no polyp. Normal location.  Penis: Penis uncircumcised, phimosis. No foreskin warts, no cracks. No dorsal peyronie's plaques, no left corporal peyronie's plaques, no right corporal peyronie's plaques, no scarring, no shaft warts. No balanitis, no meatal stenosis.     MULTI-SYSTEM PHYSICAL EXAMINATION:    Constitutional: Well-nourished. No physical deformities. Normally developed. Good grooming.  Neurologic / Psychiatric: Oriented to time, oriented to place, oriented to person. No depression, no anxiety, no agitation.     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 The risks included, but are not limited to, bleeding, infection, pain, MI, CVA, DVT, PE and the inherent risks of general anesthesia.

## 2024-03-08 NOTE — Anesthesia Procedure Notes (Signed)
 Procedure Name: LMA Insertion Date/Time: 03/08/2024 10:43 AM  Performed by: Augusta Daved SAILOR, CRNAPre-anesthesia Checklist: Patient identified, Emergency Drugs available, Suction available and Patient being monitored Patient Re-evaluated:Patient Re-evaluated prior to induction Oxygen Delivery Method: Circle System Utilized Preoxygenation: Pre-oxygenation with 100% oxygen Induction Type: IV induction Ventilation: Mask ventilation without difficulty LMA: LMA inserted LMA Size: 5.0 Number of attempts: 1 Placement Confirmation: positive ETCO2 Tube secured with: Tape Dental Injury: Teeth and Oropharynx as per pre-operative assessment

## 2024-03-08 NOTE — Transfer of Care (Signed)
 Immediate Anesthesia Transfer of Care Note  Patient: Henry Krueger  Procedure(s) Performed: CIRCUMCISION, ADULT  Patient Location: PACU  Anesthesia Type:General  Level of Consciousness: awake, alert , oriented, and patient cooperative  Airway & Oxygen Therapy: Patient Spontanous Breathing  Post-op Assessment: Report given to RN and Post -op Vital signs reviewed and stable  Post vital signs: Reviewed and stable  Last Vitals:  Vitals Value Taken Time  BP 126/82 03/05/24 12:01  Temp 97.42F oral 03/05/24 12:01  Pulse 105 03/08/24 12:00  Resp 15 03/08/24 12:00  SpO2 98 % on RA 03/08/24 12:00  Vitals shown include unfiled device data.  Last Pain:  Vitals:   03/08/24 0847  TempSrc:   PainSc: 0-No pain         Complications: No notable events documented.

## 2024-03-08 NOTE — Anesthesia Preprocedure Evaluation (Addendum)
 Anesthesia Evaluation  Patient identified by MRN, date of birth, ID band Patient awake    Reviewed: Allergy & Precautions, NPO status , Patient's Chart, lab work & pertinent test results  Airway Mallampati: II  TM Distance: >3 FB Neck ROM: Full    Dental no notable dental hx. (+) Teeth Intact, Dental Advisory Given   Pulmonary asthma    Pulmonary exam normal breath sounds clear to auscultation       Cardiovascular negative cardio ROS Normal cardiovascular exam Rhythm:Regular Rate:Normal     Neuro/Psych negative neurological ROS  negative psych ROS   GI/Hepatic negative GI ROS, Neg liver ROS,,,  Endo/Other    Class 3 obesity  Renal/GU negative Renal ROS  negative genitourinary   Musculoskeletal negative musculoskeletal ROS (+)    Abdominal  (+) + obese  Peds  Hematology negative hematology ROS (+)   Anesthesia Other Findings   Reproductive/Obstetrics Phimosis                              Anesthesia Physical Anesthesia Plan  ASA: 3  Anesthesia Plan: General   Post-op Pain Management: Minimal or no pain anticipated   Induction: Intravenous  PONV Risk Score and Plan: 4 or greater and Treatment may vary due to age or medical condition, Ondansetron  and Dexamethasone   Airway Management Planned: LMA  Additional Equipment: None  Intra-op Plan:   Post-operative Plan: Extubation in OR  Informed Consent: I have reviewed the patients History and Physical, chart, labs and discussed the procedure including the risks, benefits and alternatives for the proposed anesthesia with the patient or authorized representative who has indicated his/her understanding and acceptance.     Dental advisory given  Plan Discussed with: CRNA and Anesthesiologist  Anesthesia Plan Comments:         Anesthesia Quick Evaluation

## 2024-03-08 NOTE — Op Note (Signed)
 Operative Note  Preoperative diagnosis:  1.  Phimosis   Postoperative diagnosis: 1.  Phimosis  Procedure(s): 1.  Adult circumcision  Surgeon: Lonni Han, MD  Assistants:  None  Anesthesia:  General  Complications:  None  EBL: 5 mL  Specimens: 1.  Foreskin  Drains/Catheters: 1.  None  Intraoperative findings:   Phimosis  Indication:  Henry Krueger is a 23 y.o. male with phimosis of the penile foreskin.  He has been consented for the above procedures, voices understanding and wishes to proceed.  Description of procedure:  After informed consent was obtained, the patient was brought to the operating room and general LMA anesthesia was administered.  The patient was prepped and draped in the usual fashion.  A timeout was then performed.  Circumferential marks were then made with the first being with the foreskin in the anatomic position along the glandular impression on the second being with the foreskin retracted approximately 1 cm from the coronal sulcus.  The marks were then incised using electrocautery and the excess foreskin was excised.  The penile shaft skin was then reapproximated using interrupted 3-0 chromic suture.  A penile ring block was then performed using quarter percent Marcaine  without epinephrine.  The penis was then dressed in the usual fashion.  The patient tolerated the procedure well and was transferred to the postanesthesia unit in stable condition.  Plan:  Discharge home

## 2024-03-09 ENCOUNTER — Encounter (HOSPITAL_COMMUNITY): Payer: Self-pay | Admitting: Urology

## 2024-04-05 ENCOUNTER — Ambulatory Visit: Payer: PRIVATE HEALTH INSURANCE

## 2024-04-05 ENCOUNTER — Telehealth: Payer: Self-pay

## 2024-04-05 NOTE — Telephone Encounter (Signed)
 Called and left a message for a return call, pt has a nurse visit today, no vaccines indicated per immunization records.

## 2024-05-28 ENCOUNTER — Encounter (HOSPITAL_COMMUNITY): Payer: Self-pay | Admitting: *Deleted

## 2024-05-28 ENCOUNTER — Other Ambulatory Visit: Payer: Self-pay

## 2024-05-28 ENCOUNTER — Emergency Department (HOSPITAL_COMMUNITY)
Admission: EM | Admit: 2024-05-28 | Discharge: 2024-05-28 | Disposition: A | Discharge status: Home / Self Care | Attending: Emergency Medicine | Admitting: Emergency Medicine

## 2024-05-28 DIAGNOSIS — M542 Cervicalgia: Secondary | ICD-10-CM

## 2024-05-28 DIAGNOSIS — M546 Pain in thoracic spine: Secondary | ICD-10-CM

## 2024-05-28 MED ORDER — LIDOCAINE 5 % EX PTCH: 1.0000 | MEDICATED_PATCH | CUTANEOUS | 0 refills | Status: AC

## 2024-05-28 MED ORDER — METHOCARBAMOL 500 MG PO TABS: 500.0000 mg | ORAL_TABLET | Freq: Two times a day (BID) | ORAL | 0 refills | Status: AC | PRN

## 2024-05-28 MED ORDER — NAPROXEN 250 MG PO TABS
500.0000 mg | ORAL_TABLET | Freq: Once | ORAL | Status: AC
  Administered 2024-05-28: 500 mg via ORAL
  Filled 2024-05-28: qty 2

## 2024-05-28 MED ORDER — LIDOCAINE 5 % EX PTCH
1.0000 | MEDICATED_PATCH | CUTANEOUS | Status: DC
  Administered 2024-05-28: 1 via TRANSDERMAL
  Filled 2024-05-28: qty 1

## 2024-05-28 MED ORDER — NAPROXEN 500 MG PO TABS: 500.0000 mg | ORAL_TABLET | Freq: Two times a day (BID) | ORAL | 0 refills | Status: AC | PRN

## 2024-05-28 MED ORDER — METHOCARBAMOL 500 MG PO TABS
500.0000 mg | ORAL_TABLET | Freq: Once | ORAL | Status: AC
  Administered 2024-05-28: 500 mg via ORAL
  Filled 2024-05-28: qty 1
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
## 2024-05-28 NOTE — Discharge Instructions (Addendum)
 It was a pleasure taking care of you today.  Based on your history and physical exam I feel you are safe for discharge.  Today you deferred getting x-rays in the emergency department, you may return anytime if you would like to have them completed.  I have sent in a few medications to help with your neck and back pain.  Please take them as prescribed, please do not drive or operate heavy machinery after taking the muscle relaxant medication called Robaxin  as it can make you drowsy.I have also attached the number for orthopedics which you may see as needed if pain persists.  Please continue to monitor your symptoms and if you experiencing the following symptoms including but limited to worsening back pain, weakness, issues walking, radiating numbness/tingling, visual changes, severe headache, severe neck pain, or other concerning symptom please return the emergency department or seek further medical care.  If symptoms worsen recommend follow-up in 48 hours. Please do not take over the counter ibuprofen  with your prescription naproxen  as they are in the same medication class. Also recommend rest and ice.

## 2024-05-28 NOTE — ED Notes (Signed)
 Pt/family received d/c paperwork at this time. After going over the paperwork any questions, comments, or concerns were answered to the best of this nurse's knowledge. The pt/family verbally acknowledged the teachings/instructions.
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
